# Patient Record
Sex: Male | Born: 1980 | Race: White | Hispanic: No | Marital: Single | Smoking: Current every day smoker
Health system: Southern US, Community
[De-identification: ages and names within clinical notes are randomized; demographics above are authoritative.]

## PROBLEM LIST (undated history)

## (undated) DIAGNOSIS — F319 Bipolar disorder, unspecified: Secondary | ICD-10-CM

## (undated) DIAGNOSIS — E039 Hypothyroidism, unspecified: Secondary | ICD-10-CM

---

## 2008-09-24 ENCOUNTER — Emergency Department (HOSPITAL_COMMUNITY): Admission: EM | Admit: 2008-09-24 | Disposition: A | Discharge status: Home / Self Care | Payer: Self-pay

## 2008-09-24 ENCOUNTER — Encounter (EMERGENCY_DEPARTMENT_HOSPITAL): Payer: Self-pay

## 2009-01-17 ENCOUNTER — Encounter (EMERGENCY_DEPARTMENT_HOSPITAL): Payer: Self-pay

## 2009-01-17 ENCOUNTER — Emergency Department (EMERGENCY_DEPARTMENT_HOSPITAL): Admission: EM | Admit: 2009-01-17 | Discharge: 2009-01-17 | Payer: Self-pay

## 2009-09-11 ENCOUNTER — Emergency Department (EMERGENCY_DEPARTMENT_HOSPITAL): Admission: EM | Admit: 2009-09-11 | Discharge: 2009-09-11 | Payer: Self-pay

## 2009-09-11 ENCOUNTER — Encounter (EMERGENCY_DEPARTMENT_HOSPITAL): Payer: Self-pay

## 2010-02-13 ENCOUNTER — Emergency Department (EMERGENCY_DEPARTMENT_HOSPITAL): Payer: Self-pay

## 2010-02-13 ENCOUNTER — Emergency Department (EMERGENCY_DEPARTMENT_HOSPITAL): Admission: EM | Admit: 2010-02-13 | Discharge: 2010-02-13 | Payer: Self-pay

## 2010-02-20 ENCOUNTER — Emergency Department (EMERGENCY_DEPARTMENT_HOSPITAL): Payer: Self-pay

## 2010-02-20 ENCOUNTER — Emergency Department (EMERGENCY_DEPARTMENT_HOSPITAL): Admission: EM | Admit: 2010-02-20 | Discharge: 2010-02-20 | Payer: Self-pay

## 2010-07-19 ENCOUNTER — Emergency Department (EMERGENCY_DEPARTMENT_HOSPITAL): Payer: Self-pay

## 2010-07-19 ENCOUNTER — Emergency Department (EMERGENCY_DEPARTMENT_HOSPITAL): Admission: EM | Admit: 2010-07-19 | Discharge: 2010-07-19 | Payer: Self-pay

## 2011-03-04 ENCOUNTER — Emergency Department (EMERGENCY_DEPARTMENT_HOSPITAL): Payer: Self-pay

## 2011-03-04 ENCOUNTER — Emergency Department (EMERGENCY_DEPARTMENT_HOSPITAL): Admission: EM | Admit: 2011-03-04 | Discharge: 2011-03-04 | Payer: Self-pay

## 2012-04-16 ENCOUNTER — Emergency Department (EMERGENCY_DEPARTMENT_HOSPITAL)
Admission: EM | Admit: 2012-04-16 | Discharge: 2012-04-16 | Payer: Self-pay | Attending: Emergency Medicine | Admitting: Emergency Medicine

## 2012-04-16 ENCOUNTER — Emergency Department (EMERGENCY_DEPARTMENT_HOSPITAL): Payer: Self-pay

## 2013-01-03 ENCOUNTER — Emergency Department
Admission: EM | Admit: 2013-01-03 | Discharge: 2013-01-04 | Disposition: A | Discharge status: Home / Self Care | Payer: BC Managed Care – PPO | Attending: Emergency Medicine | Admitting: Emergency Medicine

## 2013-01-03 ENCOUNTER — Emergency Department (EMERGENCY_DEPARTMENT_HOSPITAL): Payer: MEDICAID

## 2013-01-03 ENCOUNTER — Encounter (HOSPITAL_COMMUNITY): Payer: Self-pay | Admitting: NURSE PRACTITIONER

## 2013-01-03 DIAGNOSIS — H6691 Otitis media, unspecified, right ear: Secondary | ICD-10-CM

## 2013-01-03 DIAGNOSIS — H6690 Otitis media, unspecified, unspecified ear: Secondary | ICD-10-CM | POA: Insufficient documentation

## 2013-01-03 DIAGNOSIS — H9209 Otalgia, unspecified ear: Secondary | ICD-10-CM

## 2013-01-03 MED ORDER — AMOXICILLIN 500 MG CAPSULE
500.00 mg | ORAL_CAPSULE | Freq: Three times a day (TID) | ORAL | Status: DC
Start: 2013-01-03 — End: 2013-01-07

## 2013-01-03 NOTE — ED Nurses Note (Signed)
Patient c/o right ear pain.

## 2013-01-03 NOTE — ED Nurses Note (Signed)
Discharge instructions provided. Pt verbalizes understanding of care and good understanding of prescribed medication. Pt ambulatory from the ED upon discharge.

## 2013-01-03 NOTE — ED Provider Notes (Signed)
Attending Physician: Dr. Zada Girt  CC: Right Ear Pain   HPI:  Derek Randall is a 32 y.o. male who presents to the ED via POV c/o complaining of right ear pain and pressure.  Patient states his ear has been draining too.      Review of Systems:  Constitutional: No fever or chills  Skin: No rash or wounds  HENT: No headaches Right ear pain and drainage  Eyes: No vision changes   Cardio: No chest pain  Respiratory: No shortness of breath  GI:  No nausea, vomiting, or abdominal pain  GU:  No urinary changes  MSK: No joint or back pain  Neuro: No numbness/tingling or weakness  Psychiatric: No substance abuse  All other systems reviewed and are negative or as noted in HPI.      History:  PMH:  History reviewed. No pertinent past medical history.  Previous Medications    No medications on file     PSH:  History reviewed. No pertinent past surgical history.  Social Hx:    History     Social History   . Marital Status: Single     Spouse Name: N/A     Number of Children: N/A   . Years of Education: N/A     Occupational History   . Not on file.     Social History Main Topics   . Smoking status: Not on file   . Smokeless tobacco: Not on file   . Alcohol Use: Not on file   . Drug Use: Not on file   . Sexually Active: Not on file     Other Topics Concern   . Not on file     Social History Narrative   . No narrative on file     Family Hx: No family history on file.  Allergies: Not on File    Above history reviewed with patient, changes are as documented.      Physical Exam:   Nursing notes reviewed    ED Triage Vitals   Enc Vitals Group      BP (Non-Invasive) 01/03/13 2159 126/72 mmHg      Heart Rate 01/03/13 2159 97      Respiratory Rate 01/03/13 2159 16      Temperature 01/03/13 2159 36.7 C (98.1 F)      Temp src --       SpO2-1 01/03/13 2159 99 %      Weight 01/03/13 2159 81.647 kg (180 lb)      Height 01/03/13 2159 1.88 m (6\' 2" )      Head Cir --       Peak Flow --       Pain Score --       Pain Loc --       Pain Edu? --     Excl. in GC? --      Constitutional: NAD.   HEENT: Normocephalic and atraumatic.   Mouth/Throat: Moist mucous membranes.   Eyes: Conjugate gaze, PERRL   Neck: Trachea midline. Neck supple.   Ears: Left TM pearl grey with good landmarks.  Right TM erythematous with purulent drainage   Cardiovascular: RRR, No murmurs, rubs or gallops. Intact distal pulses.  Pulmonary/Chest: BS equal bilaterally. No respiratory distress. No wheezes, rales or chest tenderness.   Abdominal: BS +. Abdomen soft, no tenderness, rebound or guarding.  Musculoskeletal: No edema, tenderness or deformity.  Skin: Warm and dry. No rash, erythema, pallor or cyanosis.  Psychiatric: Behavior  is normal.  Neurological: Alert and oriented x3. Grossly intact. Gait normal.      Course and MDM:  MDM      Impression: Pt presenting c/o right ear pain concerning for, but not limited to, otitis media v ruptured TM v otitis externa.    Plan: Medical Records reviewed. Will obtain the following labs/imaging and give pt the following medications to alleviate symptoms:  Orders Placed This Encounter   . amoxicillin (AMOXIL) 500 mg Oral Capsule   . RETURN TO WORK/SCHOOL       Labs Reviewed - No data to display  All labs were reviewed.     Radiographical Imaging:          Course:  History an physical exam  Discussed findings with the patient consistent with Otitis Media of the right ear.    Prescribed amoxicillin     Disposition:  Discharged    Disposition: Discharged    Follow up:   [facility], Resident One      As needed    [facility] Emergency Department  [address]  [place] [address]  [phone]    As needed if symptoms worsen      Clinical Impression:     Encounter Diagnosis   Name Primary?   . Otitis media of right ear Yes       Future Appointments scheduled in [place]:   No future appointments.    Current Discharge Medication List      START taking these medications    Details   amoxicillin (AMOXIL) 500 mg Oral Capsule Take 1 Cap (500 mg total) by mouth Three times  a day for 10 days  Qty: 30 Cap, Refills: 0           Standley Dakins, FNP 01/03/2013, 10:27 PM

## 2013-01-07 ENCOUNTER — Emergency Department (EMERGENCY_DEPARTMENT_HOSPITAL): Payer: BC Managed Care – PPO

## 2013-01-07 ENCOUNTER — Encounter (HOSPITAL_COMMUNITY): Payer: Self-pay

## 2013-01-07 ENCOUNTER — Emergency Department (EMERGENCY_DEPARTMENT_HOSPITAL): Payer: Self-pay

## 2013-01-07 ENCOUNTER — Emergency Department
Admission: EM | Admit: 2013-01-07 | Discharge: 2013-01-07 | Disposition: A | Discharge status: Home / Self Care | Payer: BC Managed Care – PPO | Attending: Emergency Medicine | Admitting: Emergency Medicine

## 2013-01-07 DIAGNOSIS — H65199 Other acute nonsuppurative otitis media, unspecified ear: Secondary | ICD-10-CM | POA: Insufficient documentation

## 2013-01-07 DIAGNOSIS — F17200 Nicotine dependence, unspecified, uncomplicated: Secondary | ICD-10-CM | POA: Insufficient documentation

## 2013-01-07 DIAGNOSIS — H70009 Acute mastoiditis without complications, unspecified ear: Secondary | ICD-10-CM | POA: Insufficient documentation

## 2013-01-07 MED ORDER — LACTATED RINGERS IV BOLUS
1000.0000 mL | INJECTION | Status: AC
Start: 2013-01-07 — End: 2013-01-07
  Administered 2013-01-07: 0 mL via INTRAVENOUS
  Administered 2013-01-07: 1000 mL via INTRAVENOUS

## 2013-01-07 MED ORDER — ONDANSETRON HCL (PF) 4 MG/2 ML INJECTION SOLUTION
4.00 mg | INTRAMUSCULAR | Status: AC
Start: 2013-01-07 — End: 2013-01-07
  Administered 2013-01-07: 4 mg via INTRAVENOUS
  Filled 2013-01-07: qty 2

## 2013-01-07 MED ORDER — CIPROFLOXACIN 500 MG TABLET
500.00 mg | ORAL_TABLET | Freq: Two times a day (BID) | ORAL | Status: DC
Start: 2013-01-07 — End: 2013-01-10

## 2013-01-07 MED ORDER — MECLIZINE 12.5 MG TABLET
12.5000 mg | ORAL_TABLET | Freq: Once | ORAL | Status: AC
Start: 2013-01-07 — End: 2013-01-07
  Administered 2013-01-07: 12.5 mg via ORAL
  Filled 2013-01-07: qty 1

## 2013-01-07 MED ORDER — PREDNISONE 20 MG TABLET
40.0000 mg | ORAL_TABLET | Freq: Every day | ORAL | Status: DC
Start: 2013-01-07 — End: 2013-01-10

## 2013-01-07 MED ORDER — HYDROCODONE 5 MG-ACETAMINOPHEN 325 MG TABLET
1.00 | ORAL_TABLET | Freq: Four times a day (QID) | ORAL | Status: DC | PRN
Start: 2013-01-07 — End: 2013-01-10

## 2013-01-07 MED ORDER — ANTIPYRINE-BENZOCAINE 5.4 %-1.4 % EAR DROPS
3.0000 [drp] | Freq: Once | OTIC | Status: AC
Start: 2013-01-07 — End: 2013-01-07
  Administered 2013-01-07: 3 [drp] via OTIC
  Filled 2013-01-07: qty 15

## 2013-01-07 MED ORDER — HYDROMORPHONE 1 MG/ML INJECTION WRAPPER
INJECTION | INTRAMUSCULAR | Status: AC
Start: 2013-01-07 — End: 2013-01-07
  Administered 2013-01-07: 0.4 mg via INTRAVENOUS
  Filled 2013-01-07: qty 1

## 2013-01-07 MED ORDER — HYDROMORPHONE 1 MG/ML INJECTION WRAPPER
0.4000 mg | INJECTION | Freq: Once | INTRAMUSCULAR | Status: AC
Start: 2013-01-07 — End: 2013-01-07

## 2013-01-07 MED ORDER — AMOXICILLIN 875 MG-POTASSIUM CLAVULANATE 125 MG TABLET
1.00 | ORAL_TABLET | Freq: Two times a day (BID) | ORAL | Status: AC
Start: 2013-01-07 — End: 2013-01-17

## 2013-01-07 MED ORDER — MORPHINE 4 MG/ML INJECTION SYRINGE
8.00 mg | INJECTION | INTRAMUSCULAR | Status: AC
Start: 2013-01-07 — End: 2013-01-07
  Administered 2013-01-07: 4 mg via INTRAVENOUS
  Filled 2013-01-07: qty 1

## 2013-01-07 MED ADMIN — morphine 2 mg/mL injection syringe: 4 mg | NDC 00409189001

## 2013-01-07 MED ADMIN — ondansetron HCl (PF) 4 mg/2 mL injection solution: 4 mg | INTRAVENOUS | NDC 00703722101

## 2013-01-07 MED FILL — morphine 2 mg/mL intravenous syringe: INTRAVENOUS | Qty: 2 | Status: AC

## 2013-01-07 MED FILL — ondansetron HCl (PF) 4 mg/2 mL injection solution: 4.0000 mg | INTRAMUSCULAR | Qty: 2 | Status: AC

## 2013-01-07 NOTE — ED Nurses Note (Signed)
Pt A&Ox3. MAEs. Pt reports being seen here on Monday for R ear infection, given amoxicillin and told to come back to ED for continued symptoms. Pt reports he has not finished amoxicillin prescription. Pt reports continued pain and swelling and vertigo. Waiting provider evaluation.

## 2013-01-07 NOTE — ED Nurses Note (Signed)
Patient in ENT room on [address].  Patient effusion puntured and drained by ENT, patient feels nauseous and pain.  Meds given per MAR.

## 2013-01-07 NOTE — ED Nurses Note (Signed)
ENT at bedside

## 2013-01-07 NOTE — ED Provider Notes (Signed)
The following note was written per dictation of Derek Appl, PA-C.  Derek Randall, SCRIBE  Attending: Dr. Alla Randall  CC: Ear Pain    HPI  Derek Randall is a 32 y.o. male who presents to the ED via POV c/o ear pain. Randall states that he was seen in the ED on 3/24 for right ear pain with brown drainage, right sided headache, and dizziness, symptoms began on 3/22.  Randall states dizziness is upon standing or occurs with any sudden movements. Associated decreased hearing from right ear, decreased appetite, nausea, vomiting, and myalgias. Has vomited 4-5 time since onset of symptoms. Randall was given amoxicillin at that time with minimal relief of symptoms. Randall states that he has been taking 3 tylenol, TIB for pain without relief. Pain is exacerbated when he bends over, describes pain to be a pressure like sensation. Denies double vision, numbness or tingling. Denies PMH. NKDA.     ROS  Constitutional: + dizziness, + decreased appetite. No fever, chills or weakness   Skin: No rash or diaphoresis  HENT: + headaches, + ear pain, no congestion  Eyes: No vision changes   Cardio: No chest pain, palpitations or leg swelling   Respiratory: No cough, wheezing or SOB  GI:  + nausea, + vomiting, no stool changes  GU:  No urinary changes  MSK: + myalgias.   Neuro: No seizures or LOC  Psychiatric: No depression, SI or substance abuse  All other systems reviewed and are negative.    PMH:  History reviewed. No pertinent past medical history.  PSH:  History reviewed. No pertinent past surgical history.  Social Hx:    History     Social History   . Marital Status: Single     Spouse Name: N/A     Number of Children: N/A   . Years of Education: N/A     Occupational History   . Not on file.     Social History Main Topics   . Smoking status: Current Every Day Smoker -- 1.00 packs/day     Types: Cigarettes   . Smokeless tobacco: Not on file   . Alcohol Use: Yes      Comment: rarely   . Drug Use: No   . Sexually Active: Not on file      Other Topics Concern   . Not on file     Social History Narrative   . No narrative on file     Family Hx: No family history on file.  Allergies: No Known Allergies    Above history reviewed with patient, changes are as documented.    Nursing notes reviewed.    Physical Exam  Nursing note and vitals reviewed.    Filed Vitals:    01/07/13 1218 01/07/13 1519   BP: 122/72 124/60   Pulse: 104 59   Temp: 36.7 C (98.1 F)    Resp: 16 16   Height: 1.88 m (6' 2.02")    Weight: 81.647 kg (180 lb)    SpO2: 100% 99%       Constitutional: Randall is alert and oriented and appears well-developed and well-nourished. No acute distress.   HENT:   Mouth/Throat: Oropharynx is clear and moist.   Eyes: Conjunctivae and extraocular motions are normal. Pupils are equal, round, and reactive to light.   Neck: Normal range of motion. Neck supple.   Ears: TTP of the right mastoid. Right ear canal is slightly erythematous. Otoscopic exam appears painful. No light reflex. Bulging. Small  amount of loose brownish discharge in the ear canal, may or may not be cerumen.   Cardiovascular: Normal rate, regular rhythm and intact distal pulses.    Pulmonary/Chest: Effort normal and breath sounds normal. No respiratory distress.   Abdominal: Abdomen is soft, nontender, nondistended.    Musculoskeletal: Normal range of motion.   Neurological: Randall is alert and oriented. Cranial nerves are grossly intact (2-12).    Skin: Skin is warm and dry without rash.  Color normal.      MDM      Impression/Plan: Randall presents to the ED c/o ear pain. Concern for AOM vs AOM with effusion vs mastoiditis. Appropriate imaging and labs ordered.     Orders Placed This Encounter   . CT IAMS WO IV CONTRAST   . ondansetron (ZOFRAN) 2 mg/mL injection   . LR bolus infusion 1,000 mL   . meclizine (ANTIVERT) tablet   . morphine 4 mg/mL injection   . antipyrine-benzocaine (AURALGAN) 5.4%-1.4% otic solution   . morphine injection ---[employer] Reviewed - No data to display   All labs were reviewed.    Radiographical Imaging:   Results for orders placed during the hospital encounter of 01/07/13 (from the past 72 hour(s))   CT IAMS WO IV CONTRAST     Status: Normal    Narrative:     EXAMINATION: Unenhanced CT of the IAMs performed on 07 January 2013    INDICATION: Mastoid tenderness, ear pain    FINDINGS: A right mastoid effusion is present, along with a small amount   of fluid in the middle ear on the right surrounding the ossicles.  There   is some mild mucosal thickening of the turbinates and ethmoids as well as   within the bilateral maxillary sinuses.  The included osseous structures   are unremarkable.      Impression:      Right mastoid and middle ear effusions.           3:00 PM  ENT paged.    4:06 PM  ENT paged again.     4:08 PM  ENT returned page and consulted at this time. Derek Randall.   ENT did myringotomy without tube placement. Home on cipro, Augmentin, Norco and prednisone. Follow up with ENT    No otic lidocaine drops due to myringotomy   I have reviewed and verified the information in the above note is an accurate dictation of the information supplied to Derek Randall, ED Scribe.    Derek Randall Derek [place], PA-C 01/07/2013, 9:55 PM

## 2013-01-07 NOTE — ED Nurses Note (Signed)
3 drops placed in R ear and pt medicated for pain per order, see MAR. Pt moved to RP.

## 2013-01-07 NOTE — ED Nurses Note (Signed)
 Pt OTF with ENT for laryngotomy.

## 2013-01-07 NOTE — ED Nurses Note (Signed)
Patient was given discharge instructions and verbalizes understanding. Saline lock was removed. Catheter intact and no redness or swelling at site.

## 2013-01-07 NOTE — ED Nurses Note (Signed)
Pt to CT scan.

## 2013-01-07 NOTE — ED Nurses Note (Signed)
 Patient discharged with written instructions given.  IV removed per RN.

## 2013-01-07 NOTE — ED Nurses Note (Signed)
Pt medicated for pain and nausea per order, IVF infusing, see MAR. Waiting ear drops from pharmacy.

## 2013-01-10 ENCOUNTER — Ambulatory Visit: Payer: MEDICAID | Attending: Otolaryngology | Admitting: Otolaryngology

## 2013-01-10 VITALS — BP 126/64 | HR 87 | Temp 98.0°F | Ht 73.43 in | Wt 175.5 lb

## 2013-01-10 MED ORDER — PREDNISONE 20 MG TABLET
20.0000 mg | ORAL_TABLET | Freq: Every day | ORAL | Status: DC
Start: 2013-01-10 — End: 2013-01-10

## 2013-01-10 MED ORDER — PREDNISONE 20 MG TABLET
20.0000 mg | ORAL_TABLET | Freq: Every day | ORAL | Status: DC
Start: 2013-01-10 — End: 2019-06-07

## 2013-01-12 ENCOUNTER — Ambulatory Visit (INDEPENDENT_AMBULATORY_CARE_PROVIDER_SITE_OTHER): Payer: Self-pay | Admitting: Otolaryngology

## 2013-01-12 LAB — EAR CULTURE WITH GRAM STAIN

## 2013-01-12 NOTE — Telephone Encounter (Signed)
Per Dr. Jenean Lindau, okay to be off of work for dizziness. Wrote script, patient will come to office to pick up. Advised that on RPV on Monday he should ask if he still needs to bee off of work and if he does a new excuse will need to be made.

## 2013-01-12 NOTE — Telephone Encounter (Signed)
Message copied by Caron Presume on Wed Jan 12, 2013  1:47 PM  ------       Message from: Saverio Danker       Created: Wed Jan 12, 2013 11:56 AM         >> Saverio Danker 01/12/2013 11:56 AM       [place] patient, Patient called, Patient needs a letter for work stating he is unable to be at high heights due to dizziness. Patient's job is working at [employer]. Patient's employer needs letter today or he will lose his job. Please call patient at above # when ready for pick up. Thank you.   ------

## 2013-01-17 ENCOUNTER — Ambulatory Visit (HOSPITAL_BASED_OUTPATIENT_CLINIC_OR_DEPARTMENT_OTHER): Payer: BC Managed Care – PPO | Admitting: Audiologist

## 2013-01-17 ENCOUNTER — Ambulatory Visit: Payer: BC Managed Care – PPO | Attending: Otolaryngology | Admitting: Otolaryngology

## 2013-01-17 ENCOUNTER — Encounter (INDEPENDENT_AMBULATORY_CARE_PROVIDER_SITE_OTHER): Payer: Self-pay | Admitting: Otolaryngology

## 2013-01-17 VITALS — BP 122/68 | HR 87 | Temp 98.3°F | Ht 73.07 in | Wt 169.5 lb

## 2013-01-17 DIAGNOSIS — Z9889 Other specified postprocedural states: Secondary | ICD-10-CM | POA: Insufficient documentation

## 2013-01-17 DIAGNOSIS — R42 Dizziness and giddiness: Secondary | ICD-10-CM | POA: Insufficient documentation

## 2013-01-17 DIAGNOSIS — Z0110 Encounter for examination of ears and hearing without abnormal findings: Secondary | ICD-10-CM | POA: Insufficient documentation

## 2013-01-17 DIAGNOSIS — R269 Unspecified abnormalities of gait and mobility: Secondary | ICD-10-CM | POA: Insufficient documentation

## 2013-01-17 DIAGNOSIS — H908 Mixed conductive and sensorineural hearing loss, unspecified: Secondary | ICD-10-CM | POA: Insufficient documentation

## 2013-01-17 DIAGNOSIS — H903 Sensorineural hearing loss, bilateral: Secondary | ICD-10-CM | POA: Insufficient documentation

## 2013-01-17 NOTE — Progress Notes (Addendum)
 PATIENT NAME:  Derek Randall  MRN:  [id number]  DOB:  [birth year]  DATE OF SERVICE: 01/17/2013    HPI:  Derek Randall is a 32 y.o. year old male with history of right otitis media s/p myringotomy and aspiration in ED on 01/07/2013.  Since then he has had decreased pain/pressure but hearing is still not the same as the left.  He also has episodic dizziness with vertigo and imbalance.  These episodes last about 20-30 minutes with associated right ear fullness.  He denies any dizziness before this ear infection.  He denies any further otorrhea.    Physical Exam:  Blood pressure 122/68, pulse 87, temperature 36.8 C (98.3 F), height 1.856 m (6' 1.07), weight 76.9 kg (169 lb 8.5 oz).  Body mass index is 22.32 kg/(m^2).  General Appearance: Pleasant, cooperative, healthy, and in no acute distress.  Eyes: Conjunctivae/corneas clear, EOM's intact.  Head and Face: Normocephalic, atraumatic.  Face symmetric, no obvious lesions.   Pinnae: Normal shape and position.   External auditory canals:  Patent without inflammation.  Tympanic membranes:  Right TM opaque, mobile with small crust anteriorly.  Good mobility.  Left: Intact, translucent, midposition, middle ear aerated. Mobile  Nose:  External pyramid midline. Septum midline. Mucosa normal. No purulence, polyps, or crusts.   Oral Cavity/Oropharynx: No mucosal lesions, masses, or pharyngeal asymmetry.  Neck:  No palpable thyroid, salivary gland, or neck masses.  Heme/Lymph:  No cervical adenopathy.  Cardiovascular:  Good perfusion of upper extremities.  No cyanosis of the hands or fingers.  Lungs: No apparent stridorous breathing. No acute distress.  Skin: Skin warm and dry.  Neurologic: Cranial nerves:  grossly intact.  Psychiatric:  Alert and oriented x 3.    Procedure:  No notes on file    Data Reviewed:    Audiogram 01/17/2013 -   IMP: Left is essentially WNL; Type A tymp.   Right with slight/mild mixed across freqs with mod/sev mixed dip at 4K. Type C tymp.   REC: To see Dr. Sharyle  today. Repeat audio during continued medical management.       Assessment:  Right otitis media  Right sided asymmetric mixed loss  Dizziness    Plan:  Repeat audiogram/tymp in 3 weeks, followup that day      Redell Layman Re, MD 01/17/2013, 3:31 PM    PCP:  No Established Pcp  No address on file   REF:  No referring provider defined for this encounter.             I saw and examined the patient.  I reviewed the resident's note.  I agree with the findings and plan of care as documented in the resident's note.  Any exceptions/additions are edited/noted.    Carlin FORBES Sharyle, MD 01/17/2013, 4:27 PM

## 2013-01-17 NOTE — Progress Notes (Signed)
 AUDIOGRAM    Hx: Follow up ENT visit of 01-10-13. Continued dizziness, muffled hearing. Positive for noise exposure.    IMP: Left is essentially WNL; Type A tymp.           Right with slight/mild mixed across freqs with mod/sev mixed dip at 4K. Type C tymp.     REC: To see Dr. Sharyle today. Repeat audio during continued medical management.     [facility]

## 2013-02-14 ENCOUNTER — Ambulatory Visit (INDEPENDENT_AMBULATORY_CARE_PROVIDER_SITE_OTHER): Payer: MEDICAID | Admitting: Audiologist

## 2013-02-14 ENCOUNTER — Ambulatory Visit (INDEPENDENT_AMBULATORY_CARE_PROVIDER_SITE_OTHER): Payer: MEDICAID | Admitting: Otolaryngology

## 2013-03-16 ENCOUNTER — Emergency Department (EMERGENCY_DEPARTMENT_HOSPITAL): Payer: MEDICAID

## 2013-03-16 ENCOUNTER — Emergency Department (EMERGENCY_DEPARTMENT_HOSPITAL)
Admission: EM | Admit: 2013-03-16 | Discharge: 2013-03-16 | Payer: MEDICAID | Attending: EMERGENCY MEDICINE | Admitting: EMERGENCY MEDICINE

## 2015-05-13 ENCOUNTER — Emergency Department
Admission: EM | Admit: 2015-05-13 | Discharge: 2015-05-13 | Disposition: A | Discharge status: Home / Self Care | Payer: Medicaid - Out of State | Attending: Emergency Medicine | Admitting: Emergency Medicine

## 2015-05-13 ENCOUNTER — Encounter: Payer: Self-pay | Admitting: Emergency Medicine

## 2015-05-13 DIAGNOSIS — W260XXA Contact with knife, initial encounter: Secondary | ICD-10-CM | POA: Insufficient documentation

## 2015-05-13 DIAGNOSIS — Y998 Other external cause status: Secondary | ICD-10-CM | POA: Insufficient documentation

## 2015-05-13 DIAGNOSIS — S81812A Laceration without foreign body, left lower leg, initial encounter: Secondary | ICD-10-CM | POA: Insufficient documentation

## 2015-05-13 DIAGNOSIS — Z23 Encounter for immunization: Secondary | ICD-10-CM | POA: Diagnosis not present

## 2015-05-13 DIAGNOSIS — Z720 Tobacco use: Secondary | ICD-10-CM | POA: Insufficient documentation

## 2015-05-13 DIAGNOSIS — Y9389 Activity, other specified: Secondary | ICD-10-CM | POA: Diagnosis not present

## 2015-05-13 DIAGNOSIS — Y9289 Other specified places as the place of occurrence of the external cause: Secondary | ICD-10-CM | POA: Insufficient documentation

## 2015-05-13 MED ORDER — LIDOCAINE-EPINEPHRINE (PF) 1 %-1:200000 IJ SOLN
30.0000 mL | Freq: Once | INTRAMUSCULAR | Status: AC
  Administered 2015-05-13: 10 mL via INTRADERMAL
  Filled 2015-05-13: qty 30

## 2015-05-13 MED ORDER — KETOROLAC TROMETHAMINE 10 MG PO TABS
20.0000 mg | ORAL_TABLET | Freq: Once | ORAL | Status: AC
  Administered 2015-05-13: 20 mg via ORAL
  Filled 2015-05-13: qty 2

## 2015-05-13 MED ORDER — KETOROLAC TROMETHAMINE 10 MG PO TABS: 10.0000 mg | ORAL_TABLET | Freq: Three times a day (TID) | ORAL | Status: AC

## 2015-05-13 MED ORDER — TETANUS-DIPHTH-ACELL PERTUSSIS 5-2.5-18.5 LF-MCG/0.5 IM SUSP
0.5000 mL | Freq: Once | INTRAMUSCULAR | Status: AC
  Administered 2015-05-13: 0.5 mL via INTRAMUSCULAR
  Filled 2015-05-13: qty 0.5

## 2015-05-13 NOTE — ED Notes (Signed)
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 Patient with laceration to leg. Bleeding controlled at this time.

## 2015-05-13 NOTE — ED Provider Notes (Signed)
[facility] Emergency Department Provider Note ____________________________________________  Time seen: 2050  I have reviewed the triage vital signs and the nursing notes.  HISTORY  Chief Complaint  Extremity Laceration  HPI Brett Reed is a 34 y.o. male reports to the ED for treatment of laceration to the left lower leg, sustained while at work today. He reports an accidental, self-inflicted laceration to the left lower calf while using a knife. He denies any other injury at this time, bleeding is currently controlled. His tetanus is not current according to the patient. He reports his pain at 9/10 in triage.  No past medical history on file.  There are no active problems to display for this patient.  No past surgical history on file.  Current Outpatient Rx  Name  Route  Sig  Dispense  Refill  . ketorolac (TORADOL) 10 MG tablet   Oral   Take 1 tablet (10 mg total) by mouth every 8 (eight) hours.   15 tablet   0    Allergies Codeine  History reviewed. No pertinent family history.  Social History History  Substance Use Topics  . Smoking status: Current Every Day Smoker -- 1.00 packs/day for 15 years    Types: Cigarettes  . Smokeless tobacco: Not on file  . Alcohol Use: No   Review of Systems  Constitutional: Negative for fever. Eyes: Negative for visual changes. ENT: Negative for sore throat. Cardiovascular: Negative for chest pain. Respiratory: Negative for shortness of breath. Gastrointestinal: Negative for abdominal pain, vomiting and diarrhea. Genitourinary: Negative for dysuria. Musculoskeletal: Negative for back pain. Skin: Negative for rash. Laceration as above. Neurological: Negative for headaches, focal weakness or numbness. ____________________________________________  PHYSICAL EXAM:  VITAL SIGNS: ED Triage Vitals  Enc Vitals Group     BP 05/13/15 1910 133/76 mmHg     Pulse Rate 05/13/15 1910 97     Resp 05/13/15 1910 20      Temp 05/13/15 1910 98.2 F (36.8 C)     Temp Source 05/13/15 1910 Oral     SpO2 05/13/15 1910 97 %     Weight 05/13/15 1910 180 lb (81.647 kg)     Height 05/13/15 1910 6\' 2"  (1.88 m)     Head Cir --      Peak Flow --      Pain Score 05/13/15 1911 9     Pain Loc --      Pain Edu? --      Excl. in GC? --    Constitutional: Alert and oriented. Well appearing and in no distress. Eyes: Conjunctivae are normal. PERRL. Normal extraocular movements. ENT   Head: Normocephalic and atraumatic.   Nose: No congestion/rhinnorhea.   Mouth/Throat: Mucous membranes are moist. Cardiovascular: Normal distal pulses. Respiratory: Normal respiratory effort. Musculoskeletal: Nontender with normal range of motion in all extremities. Linear laceration to the lower left calf laterally. Bleeding controlled.  Neurologic:  Normal gait without ataxia. Normal speech and language. No gross focal neurologic deficits are appreciated. Skin:  Skin is warm, dry and intact. No rash noted. Psychiatric: Mood and affect are normal. Patient exhibits appropriate insight and judgment. ____________________________________________  PROCEDURES  Tdap booster provided  LACERATION REPAIR Performed by: Lissa Hoard Authorized by: Lissa Hoard Consent: Verbal consent obtained. Risks and benefits: risks, benefits and alternatives were discussed Consent given by: patient Patient identity confirmed: provided demographic data Prepped and Draped in normal sterile fashion Wound explored  Laceration Location: Left lower leg  Laceration  Length: 6 cm  No Foreign Bodies seen or palpated  Anesthesia: local infiltration  Local anesthetic: lidocaine 1% w/epinephrine  Anesthetic total: 5 ml  Irrigation method: syringe Amount of cleaning: standard  Skin closure: 4-0 nylon   Number of sutures: 9  Technique: interrupted  Patient tolerance: Patient tolerated the procedure well with no  immediate complications. ____________________________________________  INITIAL IMPRESSION / ASSESSMENT AND PLAN / ED COURSE  Wound care instructions provider.  Patient requesting pain medicine prescription following procedure. Advised that narcotics would not be prescribed, given Toradol #15 for pain. Return to the ED in 10-12 days for suture removal.  ____________________________________________  FINAL CLINICAL IMPRESSION(S) / ED DIAGNOSES  Final diagnoses:  Laceration of leg, left, initial encounter     Lissa Hoard, PA-C 05/14/15 0010  Emily Filbert, MD 05/14/15 [phone]

## 2015-05-13 NOTE — Discharge Instructions (Signed)
Laceration Care, Adult A laceration is a cut that goes through all layers of the skin. The cut goes into the tissue beneath the skin. HOME CARE For stitches (sutures) or staples:  Keep the cut clean and dry.  If you have a bandage (dressing), change it at least once a day. Change the bandage if it gets wet or dirty, or as told by your doctor.  Wash the cut with soap and water 2 times a day. Rinse the cut with water. Pat it dry with a clean towel.  Put a thin layer of medicated cream on the cut as told by your doctor.  You may shower after the first 24 hours. Do not soak the cut in water until the stitches are removed.  Only take medicines as told by your doctor.  Have your stitches or staples removed as told by your doctor. For skin adhesive strips:  Keep the cut clean and dry.  Do not get the strips wet. You may take a bath, but be careful to keep the cut dry.  If the cut gets wet, pat it dry with a clean towel.  The strips will fall off on their own. Do not remove the strips that are still stuck to the cut. For wound glue:  You may shower or take baths. Do not soak or scrub the cut. Do not swim. Avoid heavy sweating until the glue falls off on its own. After a shower or bath, pat the cut dry with a clean towel.  Do not put medicine on your cut until the glue falls off.  If you have a bandage, do not put tape over the glue.  Avoid lots of sunlight or tanning lamps until the glue falls off. Put sunscreen on the cut for the first year to reduce your scar.  The glue will fall off on its own. Do not pick at the glue. You may need a tetanus shot if:  You cannot remember when you had your last tetanus shot.  You have never had a tetanus shot. If you need a tetanus shot and you choose not to have one, you may get tetanus. Sickness from tetanus can be serious. GET HELP RIGHT AWAY IF:   Your pain does not get better with medicine.  Your arm, hand, leg, or foot loses feeling  (numbness) or changes color.  Your cut is bleeding.  Your joint feels weak, or you cannot use your joint.  You have painful lumps on your body.  Your cut is red, puffy (swollen), or painful.  You have a red line on the skin near the cut.  You have yellowish-white fluid (pus) coming from the cut.  You have a fever.  You have a bad smell coming from the cut or bandage.  Your cut breaks open before or after stitches are removed.  You notice something coming out of the cut, such as wood or glass.  You cannot move a finger or toe. MAKE SURE YOU:   Understand these instructions.  Will watch your condition.  Will get help right away if you are not doing well or get worse. Document Released: 03/17/2008 Document Revised: 12/22/2011 Document Reviewed: 03/25/2011 [facility] Patient Information 2015 [place], [place]. This information is not intended to replace advice given to you by your health care provider. Make sure you discuss any questions you have with your health care provider.   Keep the wound clean, dry, and covered. Wash only with soap & water. Return in 10-12 days  for suture removal.

## 2015-08-15 ENCOUNTER — Emergency Department: Payer: Medicaid - Out of State

## 2015-08-15 ENCOUNTER — Encounter: Payer: Self-pay | Admitting: Emergency Medicine

## 2015-08-15 ENCOUNTER — Emergency Department
Admission: EM | Admit: 2015-08-15 | Discharge: 2015-08-15 | Disposition: A | Discharge status: Home / Self Care | Payer: Medicaid - Out of State | Attending: Emergency Medicine | Admitting: Emergency Medicine

## 2015-08-15 ENCOUNTER — Emergency Department
Admission: EM | Admit: 2015-08-15 | Discharge: 2015-08-15 | Disposition: A | Discharge status: Home / Self Care | Payer: Medicaid - Out of State | Source: Home / Self Care | Attending: Emergency Medicine | Admitting: Emergency Medicine

## 2015-08-15 DIAGNOSIS — S0990XA Unspecified injury of head, initial encounter: Secondary | ICD-10-CM | POA: Diagnosis not present

## 2015-08-15 DIAGNOSIS — Y92410 Unspecified street and highway as the place of occurrence of the external cause: Secondary | ICD-10-CM | POA: Insufficient documentation

## 2015-08-15 DIAGNOSIS — Z720 Tobacco use: Secondary | ICD-10-CM | POA: Diagnosis not present

## 2015-08-15 DIAGNOSIS — Z0283 Encounter for blood-alcohol and blood-drug test: Secondary | ICD-10-CM | POA: Insufficient documentation

## 2015-08-15 DIAGNOSIS — M7918 Myalgia, other site: Secondary | ICD-10-CM

## 2015-08-15 DIAGNOSIS — Y9389 Activity, other specified: Secondary | ICD-10-CM | POA: Diagnosis not present

## 2015-08-15 DIAGNOSIS — S199XXA Unspecified injury of neck, initial encounter: Secondary | ICD-10-CM | POA: Diagnosis not present

## 2015-08-15 DIAGNOSIS — Y998 Other external cause status: Secondary | ICD-10-CM | POA: Diagnosis not present

## 2015-08-15 DIAGNOSIS — S3991XA Unspecified injury of abdomen, initial encounter: Secondary | ICD-10-CM | POA: Insufficient documentation

## 2015-08-15 DIAGNOSIS — S0001XA Abrasion of scalp, initial encounter: Secondary | ICD-10-CM | POA: Insufficient documentation

## 2015-08-15 DIAGNOSIS — Z791 Long term (current) use of non-steroidal anti-inflammatories (NSAID): Secondary | ICD-10-CM | POA: Insufficient documentation

## 2015-08-15 DIAGNOSIS — T148 Other injury of unspecified body region: Secondary | ICD-10-CM | POA: Diagnosis not present

## 2015-08-15 DIAGNOSIS — T07XXXA Unspecified multiple injuries, initial encounter: Secondary | ICD-10-CM

## 2015-08-15 DIAGNOSIS — IMO0001 Reserved for inherently not codable concepts without codable children: Secondary | ICD-10-CM

## 2015-08-15 DIAGNOSIS — T148XXA Other injury of unspecified body region, initial encounter: Secondary | ICD-10-CM

## 2015-08-15 DIAGNOSIS — S0081XA Abrasion of other part of head, initial encounter: Secondary | ICD-10-CM | POA: Insufficient documentation

## 2015-08-15 DIAGNOSIS — S3992XA Unspecified injury of lower back, initial encounter: Secondary | ICD-10-CM | POA: Insufficient documentation

## 2015-08-15 DIAGNOSIS — R911 Solitary pulmonary nodule: Secondary | ICD-10-CM | POA: Insufficient documentation

## 2015-08-15 DIAGNOSIS — Z23 Encounter for immunization: Secondary | ICD-10-CM | POA: Insufficient documentation

## 2015-08-15 HISTORY — DX: Hypothyroidism, unspecified: E03.9

## 2015-08-15 LAB — PROTIME-INR
INR: 0.96
PROTHROMBIN TIME: 13 s (ref 11.4–15.0)

## 2015-08-15 LAB — COMPREHENSIVE METABOLIC PANEL
ALT: 21 U/L (ref 17–63)
ANION GAP: 6 (ref 5–15)
AST: 28 U/L (ref 15–41)
Albumin: 4.1 g/dL (ref 3.5–5.0)
Alkaline Phosphatase: 60 U/L (ref 38–126)
BUN: 14 mg/dL (ref 6–20)
CO2: 28 mmol/L (ref 22–32)
Calcium: 8.8 mg/dL — ABNORMAL LOW (ref 8.9–10.3)
Chloride: 104 mmol/L (ref 101–111)
Creatinine, Ser: 1.25 mg/dL — ABNORMAL HIGH (ref 0.61–1.24)
Glucose, Bld: 106 mg/dL — ABNORMAL HIGH (ref 65–99)
POTASSIUM: 3.9 mmol/L (ref 3.5–5.1)
SODIUM: 138 mmol/L (ref 135–145)
Total Bilirubin: 0.4 mg/dL (ref 0.3–1.2)
Total Protein: 7.1 g/dL (ref 6.5–8.1)

## 2015-08-15 LAB — CBC WITH DIFFERENTIAL/PLATELET
BASOS PCT: 1 %
Basophils Absolute: 0.1 10*3/uL (ref 0–0.1)
Eosinophils Absolute: 0.2 10*3/uL (ref 0–0.7)
Eosinophils Relative: 2 %
HEMATOCRIT: 41.2 % (ref 40.0–52.0)
HEMOGLOBIN: 13.8 g/dL (ref 13.0–18.0)
Lymphocytes Relative: 23 %
Lymphs Abs: 2.6 10*3/uL (ref 1.0–3.6)
MCH: 30.8 pg (ref 26.0–34.0)
MCHC: 33.6 g/dL (ref 32.0–36.0)
MCV: 91.9 fL (ref 80.0–100.0)
Monocytes Absolute: 0.9 10*3/uL (ref 0.2–1.0)
Monocytes Relative: 8 %
NEUTROS ABS: 7.6 10*3/uL — AB (ref 1.4–6.5)
NEUTROS PCT: 66 %
Platelets: 141 10*3/uL — ABNORMAL LOW (ref 150–440)
RBC: 4.49 MIL/uL (ref 4.40–5.90)
RDW: 13.8 % (ref 11.5–14.5)
WBC: 11.5 10*3/uL — ABNORMAL HIGH (ref 3.8–10.6)

## 2015-08-15 LAB — ETHANOL

## 2015-08-15 LAB — APTT: APTT: 32 s (ref 24–36)

## 2015-08-15 LAB — LIPASE, BLOOD: Lipase: 23 U/L (ref 11–51)

## 2015-08-15 MED ORDER — FENTANYL CITRATE (PF) 100 MCG/2ML IJ SOLN
50.0000 ug | Freq: Once | INTRAMUSCULAR | Status: AC
  Administered 2015-08-15: 50 ug via INTRAVENOUS
  Filled 2015-08-15: qty 2

## 2015-08-15 MED ORDER — PROMETHAZINE HCL 25 MG/ML IJ SOLN
INTRAMUSCULAR | Status: AC
  Administered 2015-08-15: 25 mg via INTRAMUSCULAR
  Filled 2015-08-15: qty 1

## 2015-08-15 MED ORDER — SODIUM CHLORIDE 0.9 % IV BOLUS (SEPSIS)
500.0000 mL | Freq: Once | INTRAVENOUS | Status: AC
  Administered 2015-08-15: 500 mL via INTRAVENOUS

## 2015-08-15 MED ORDER — IOHEXOL 350 MG/ML SOLN
100.0000 mL | Freq: Once | INTRAVENOUS | Status: AC | PRN
  Administered 2015-08-15: 100 mL via INTRAVENOUS

## 2015-08-15 MED ORDER — PROMETHAZINE HCL 25 MG/ML IJ SOLN
25.0000 mg | Freq: Once | INTRAMUSCULAR | Status: AC
  Administered 2015-08-15: 25 mg via INTRAMUSCULAR

## 2015-08-15 MED ORDER — TETANUS-DIPHTH-ACELL PERTUSSIS 5-2.5-18.5 LF-MCG/0.5 IM SUSP
0.5000 mL | Freq: Once | INTRAMUSCULAR | Status: AC
  Administered 2015-08-15: 0.5 mL via INTRAMUSCULAR
  Filled 2015-08-15: qty 0.5
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
## 2015-08-15 NOTE — Discharge Instructions (Signed)
Please follow-up with a primary care physician as soon as possible for recheck. Please take Tylenol or Motrin as needed for discomfort as written on the box.    Musculoskeletal Pain Musculoskeletal pain is muscle and boney aches and pains. These pains can occur in any part of the body. Your caregiver may treat you without knowing the cause of the pain. They may treat you if blood or urine tests, X-rays, and other tests were normal.  CAUSES There is often not a definite cause or reason for these pains. These pains may be caused by a type of germ (virus). The discomfort may also come from overuse. Overuse includes working out too hard when your body is not fit. Boney aches also come from weather changes. Bone is sensitive to atmospheric pressure changes. HOME CARE INSTRUCTIONS   Ask when your test results will be ready. Make sure you get your test results.  Only take over-the-counter or prescription medicines for pain, discomfort, or fever as directed by your caregiver. If you were given medications for your condition, do not drive, operate machinery or power tools, or sign legal documents for 24 hours. Do not drink alcohol. Do not take sleeping pills or other medications that may interfere with treatment.  Continue all activities unless the activities cause more pain. When the pain lessens, slowly resume normal activities. Gradually increase the intensity and duration of the activities or exercise.  During periods of severe pain, bed rest may be helpful. Lay or sit in any position that is comfortable.  Putting ice on the injured area.  Put ice in a bag.  Place a towel between your skin and the bag.  Leave the ice on for 15 to 20 minutes, 3 to 4 times a day.  Follow up with your caregiver for continued problems and no reason can be found for the pain. If the pain becomes worse or does not go away, it may be necessary to repeat tests or do additional testing. Your caregiver may need to look  further for a possible cause. SEEK IMMEDIATE MEDICAL CARE IF:  You have pain that is getting worse and is not relieved by medications.  You develop chest pain that is associated with shortness or breath, sweating, feeling sick to your stomach (nauseous), or throw up (vomit).  Your pain becomes localized to the abdomen.  You develop any new symptoms that seem different or that concern you. MAKE SURE YOU:   Understand these instructions.  Will watch your condition.  Will get help right away if you are not doing well or get worse.   This information is not intended to replace advice given to you by your health care provider. Make sure you discuss any questions you have with your health care provider.   Document Released: 09/29/2005 Document Revised: 12/22/2011 Document Reviewed: 06/03/2013 Elsevier Interactive Patient Education [employer]
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
## 2015-08-15 NOTE — ED Notes (Signed)
Wound cleansed and dressed with gauze. Understood dc instructions

## 2015-08-31 ENCOUNTER — Encounter: Payer: Self-pay | Admitting: *Deleted

## 2015-08-31 ENCOUNTER — Emergency Department
Admission: EM | Admit: 2015-08-31 | Discharge: 2015-08-31 | Disposition: A | Discharge status: Home / Self Care | Payer: Medicaid - Out of State | Attending: Emergency Medicine | Admitting: Emergency Medicine

## 2015-08-31 ENCOUNTER — Emergency Department: Payer: Medicaid - Out of State

## 2015-08-31 DIAGNOSIS — F17210 Nicotine dependence, cigarettes, uncomplicated: Secondary | ICD-10-CM | POA: Diagnosis not present

## 2015-08-31 DIAGNOSIS — L0201 Cutaneous abscess of face: Secondary | ICD-10-CM | POA: Diagnosis not present

## 2015-08-31 DIAGNOSIS — L03211 Cellulitis of face: Secondary | ICD-10-CM | POA: Insufficient documentation

## 2015-08-31 DIAGNOSIS — F1210 Cannabis abuse, uncomplicated: Secondary | ICD-10-CM | POA: Diagnosis not present

## 2015-08-31 DIAGNOSIS — F1510 Other stimulant abuse, uncomplicated: Secondary | ICD-10-CM | POA: Diagnosis not present

## 2015-08-31 DIAGNOSIS — F1410 Cocaine abuse, uncomplicated: Secondary | ICD-10-CM | POA: Insufficient documentation

## 2015-08-31 DIAGNOSIS — L089 Local infection of the skin and subcutaneous tissue, unspecified: Secondary | ICD-10-CM | POA: Diagnosis present

## 2015-08-31 DIAGNOSIS — Z791 Long term (current) use of non-steroidal anti-inflammatories (NSAID): Secondary | ICD-10-CM | POA: Diagnosis not present

## 2015-08-31 HISTORY — DX: Bipolar disorder, unspecified: F31.9

## 2015-08-31 LAB — CBC WITH DIFFERENTIAL/PLATELET
BASOS ABS: 0.1 10*3/uL (ref 0–0.1)
Basophils Relative: 1 %
EOS ABS: 0.1 10*3/uL (ref 0–0.7)
EOS PCT: 1 %
HCT: 42.9 % (ref 40.0–52.0)
Hemoglobin: 14.6 g/dL (ref 13.0–18.0)
Lymphocytes Relative: 19 %
Lymphs Abs: 2 10*3/uL (ref 1.0–3.6)
MCH: 30.4 pg (ref 26.0–34.0)
MCHC: 33.9 g/dL (ref 32.0–36.0)
MCV: 89.7 fL (ref 80.0–100.0)
Monocytes Absolute: 0.7 10*3/uL (ref 0.2–1.0)
Monocytes Relative: 6 %
Neutro Abs: 7.8 10*3/uL — ABNORMAL HIGH (ref 1.4–6.5)
Neutrophils Relative %: 73 %
Platelets: 234 10*3/uL (ref 150–440)
RBC: 4.79 MIL/uL (ref 4.40–5.90)
RDW: 13.7 % (ref 11.5–14.5)
WBC: 10.7 10*3/uL — AB (ref 3.8–10.6)

## 2015-08-31 LAB — URINALYSIS COMPLETE WITH MICROSCOPIC (ARMC ONLY)
BILIRUBIN URINE: NEGATIVE
Bacteria, UA: NONE SEEN
GLUCOSE, UA: NEGATIVE mg/dL
Hgb urine dipstick: NEGATIVE
Ketones, ur: NEGATIVE mg/dL
Leukocytes, UA: NEGATIVE
Nitrite: NEGATIVE
PROTEIN: NEGATIVE mg/dL
Specific Gravity, Urine: 1.044 — ABNORMAL HIGH (ref 1.005–1.030)
Squamous Epithelial / LPF: NONE SEEN
pH: 7 (ref 5.0–8.0)

## 2015-08-31 LAB — COMPREHENSIVE METABOLIC PANEL
ALK PHOS: 82 U/L (ref 38–126)
ALT: 12 U/L — ABNORMAL LOW (ref 17–63)
AST: 17 U/L (ref 15–41)
Albumin: 3.8 g/dL (ref 3.5–5.0)
Anion gap: 6 (ref 5–15)
BILIRUBIN TOTAL: 0.5 mg/dL (ref 0.3–1.2)
BUN: 10 mg/dL (ref 6–20)
CALCIUM: 9.1 mg/dL (ref 8.9–10.3)
CO2: 28 mmol/L (ref 22–32)
Chloride: 108 mmol/L (ref 101–111)
Creatinine, Ser: 0.96 mg/dL (ref 0.61–1.24)
GFR calc Af Amer: >60 mL/min (ref >60)
Glucose, Bld: 115 mg/dL — ABNORMAL HIGH (ref 65–99)
Potassium: 4 mmol/L (ref 3.5–5.1)
Sodium: 142 mmol/L (ref 135–145)
TOTAL PROTEIN: 7.4 g/dL (ref 6.5–8.1)

## 2015-08-31 LAB — URINE DRUG SCREEN, QUALITATIVE (ARMC ONLY)
Amphetamines, Ur Screen: POSITIVE — AB
BARBITURATES, UR SCREEN: NOT DETECTED
BENZODIAZEPINE, UR SCRN: NOT DETECTED
CANNABINOID 50 NG, UR ~~LOC~~: POSITIVE — AB
Cocaine Metabolite,Ur ~~LOC~~: POSITIVE — AB
MDMA (Ecstasy)Ur Screen: NOT DETECTED
Methadone Scn, Ur: NOT DETECTED
Opiate, Ur Screen: NOT DETECTED
PHENCYCLIDINE (PCP) UR S: NOT DETECTED
Tricyclic, Ur Screen: NOT DETECTED

## 2015-08-31 MED ORDER — IOHEXOL 300 MG/ML  SOLN
75.0000 mL | Freq: Once | INTRAMUSCULAR | Status: AC | PRN
  Administered 2015-08-31: 75 mL via INTRAVENOUS

## 2015-08-31 MED ORDER — OXYCODONE-ACETAMINOPHEN 5-325 MG PO TABS: 1.0000 | ORAL_TABLET | Freq: Four times a day (QID) | ORAL | Status: AC | PRN

## 2015-08-31 MED ORDER — OXYCODONE-ACETAMINOPHEN 5-325 MG PO TABS
1.0000 | ORAL_TABLET | Freq: Once | ORAL | Status: AC
  Administered 2015-08-31: 1 via ORAL
  Filled 2015-08-31: qty 1

## 2015-08-31 MED ORDER — CLINDAMYCIN HCL 300 MG PO CAPS: 300.0000 mg | ORAL_CAPSULE | Freq: Three times a day (TID) | ORAL | Status: AC

## 2015-08-31 MED ORDER — HYDROMORPHONE HCL 1 MG/ML IJ SOLN
1.0000 mg | Freq: Once | INTRAMUSCULAR | Status: AC
Start: 2015-08-31 — End: 2015-08-31
  Administered 2015-08-31: 1 mg via INTRAVENOUS
  Filled 2015-08-31: qty 1

## 2015-08-31 MED ORDER — SODIUM CHLORIDE 0.9 % IV BOLUS (SEPSIS)
1000.0000 mL | Freq: Once | INTRAVENOUS | Status: AC
  Administered 2015-08-31: 1000 mL via INTRAVENOUS

## 2015-08-31 MED ORDER — MUPIROCIN CALCIUM 2 % EX CREA: TOPICAL_CREAM | CUTANEOUS | Status: AC

## 2015-08-31 MED ORDER — CLINDAMYCIN PHOSPHATE 600 MG/50ML IV SOLN
600.0000 mg | Freq: Once | INTRAVENOUS | Status: AC
  Administered 2015-08-31: 600 mg via INTRAVENOUS
  Filled 2015-08-31: qty 50

## 2015-08-31 NOTE — ED Notes (Signed)
Patient transported to CT 

## 2015-08-31 NOTE — ED Notes (Signed)
Pt verbalized understanding of discharge instructions. NAD at this time. 

## 2015-08-31 NOTE — ED Notes (Signed)
Pt reports history of staph infections, pt has left sided facial swelling with redness and a large red area to left cheek

## 2015-08-31 NOTE — Discharge Instructions (Signed)

## 2015-08-31 NOTE — ED Notes (Signed)
Asked pt for urine sample, pt states unable to give one at this time. RN notified caring for pt

## 2015-08-31 NOTE — ED Provider Notes (Signed)
[facility] Emergency Department Provider Note  ____________________________________________   I have reviewed the triage vital signs and the nursing notes.   HISTORY  Chief Complaint Recurrent Skin Infections    HPI Brett Reed is a 34 y.o. 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 Patient states he does have a history of MRSA had some on his thumb and multiple other small lesions on his body. They have all resolved but the one on his face which she has been picking at, is now more inflamed and swollen. He denies fever chills nausea vomiting change in vision or pharyngeal swelling or other complaint. Patient is requesting IV narcotics.  Past Medical History  Diagnosis Date  . Hypothyroid   . Bipolar 1 disorder (HCC)     There are no active problems to display for this patient.   History reviewed. No pertinent past surgical history.  Current Outpatient Rx  Name  Route  Sig  Dispense  Refill  . ketorolac (TORADOL) 10 MG tablet   Oral   Take 1 tablet (10 mg total) by mouth every 8 (eight) hours.   15 tablet   0     Allergies Codeine  No family history on file.  Social History Social History  Substance Use Topics  . Smoking status: Current Every Day Smoker -- 1.00 packs/day for 15 years    Types: Cigarettes  . Smokeless tobacco: None  . Alcohol Use: No    Review of Systems Constitutional: No fever/chills Eyes: No visual changes. ENT: No sore throat. No stiff neck no neck pain Cardiovascular: Denies chest pain. Respiratory: Denies shortness of breath. Gastrointestinal:   no vomiting.  No diarrhea.  No constipation. Genitourinary: Negative for dysuria. Musculoskeletal: Negative lower extremity swelling Skin: See history of present illness Neurological: Negative for headaches, focal weakness or numbness. 10-point ROS otherwise negative.  ____________________________________________   PHYSICAL  EXAM:  VITAL SIGNS: ED Triage Vitals  Enc Vitals Group     BP 08/31/15 1048 108/69 mmHg     Pulse Rate 08/31/15 1048 130     Resp 08/31/15 1048 20     Temp 08/31/15 1048 98.5 F (36.9 C)     Temp Source 08/31/15 1048 Oral     SpO2 08/31/15 1048 98 %     Weight 08/31/15 1048 180 lb (81.647 kg)     Height 08/31/15 1048  (1.88 m)     Head Cir --      Peak Flow --      Pain Score 08/31/15 1049 5     Pain Loc --      Pain Edu? --      Excl. in GC? --     Constitutional: Alert and oriented. Well appearing and in no acute distress. Eyes: Conjunctivae are normal. PERRL. EOMI. Head: Atraumatic. Nose: No congestion/rhinnorhea. Mouth/Throat: Mucous membranes are moist.  Oropharynx non-erythematous. Neck: No stridor.   Nontender with no meningismus Cardiovascular: Normal rate, regular rhythm. Grossly normal heart sounds.  Good peripheral circulation. Respiratory: Normal respiratory effort.  No retractions. Lungs CTAB. Abdominal: Soft and nontender. No distention. No guarding no rebound Back:  There is no focal tenderness or step off there is no midline tenderness there are no lesions noted. there is no CVA tenderness Musculoskeletal: No lower extremity tenderness. No joint effusions, no DVT signs strong distal pulses no edema Neurologic:  Normal speech and language. No gross focal neurologic deficits are appreciated.  Skin: There  is noted a pustule with purulent drainage but no fluctuance, there is erythema and smiled induration to the left cheek which is proximally 3 x 2 cm. There is no surrounding cellulitic changes. There are other small pustules noted. There is no evidence of significant lymphadenopathy. Psychiatric: Mood and affect are normal. Speech and behavior are normal.  ____________________________________________   LABS (all labs ordered are listed, but only abnormal results are displayed)  Labs Reviewed  CBC WITH DIFFERENTIAL/PLATELET - Abnormal; Notable for the  following:    WBC 10.7 (*)    Neutro Abs 7.8 (*)    All other components within normal limits  COMPREHENSIVE METABOLIC PANEL - Abnormal; Notable for the following:    Glucose, Bld 115 (*)    ALT 12 (*)    All other components within normal limits  URINALYSIS COMPLETEWITH MICROSCOPIC ([facility] ONLY) - Abnormal; Notable for the following:    Color, Urine YELLOW (*)    APPearance HAZY (*)    Specific Gravity, Urine 1.044 (*)    All other components within normal limits  WOUND CULTURE  URINE DRUG SCREEN, QUALITATIVE ([facility] ONLY)   ____________________________________________  EKG  I personally interpreted any EKGs ordered by me or triage  ____________________________________________  RADIOLOGY  I reviewed any imaging ordered by me or triage that were performed during my shift ____________________________________________   PROCEDURES  Procedure(s) performed: None  Critical Care performed: None  ____________________________________________   INITIAL IMPRESSION / ASSESSMENT AND PLAN / ED COURSE  Pertinent labs & imaging results that were available during my care of the patient were reviewed by me and considered in my medical decision making (see chart for details).  Patient with a facial cellulitis with some degree of induration, CT was obtained to see if it required drainage, it does not. There is a phlegmon with no evidence of abscess which is congruent with his exam. The patient has no fever or systemic illness his white count is not markedly elevated. This is therefore not isolated cellulitis of the face. Discussed with ENT on call, who do not feel the patient requires admission for this. We'll give him IV clindamycin and oral clindamycin. I have stressed to the patient that he does need to be compliant. He is demanding narcotic pain medication on a regular basis here despite receiving Dilaudid. I will not give him more IV pain medication we'll transition him to oral pain  medication will try to give him discharge. I have advised that he return to the emergency room for close follow-up. ____________________________________________   FINAL CLINICAL IMPRESSION(S) / ED DIAGNOSES  Final diagnoses:  Facial abscess     Jeanmarie PlantJames A McShane, MD 08/31/15 1427

## 2015-09-03 LAB — WOUND CULTURE
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
## 2019-06-07 ENCOUNTER — Emergency Department
Admission: EM | Admit: 2019-06-07 | Discharge: 2019-06-07 | Disposition: A | Discharge status: Home / Self Care | Payer: Medicaid Other | Attending: Emergency Medicine | Admitting: Emergency Medicine

## 2019-06-07 ENCOUNTER — Encounter (HOSPITAL_COMMUNITY): Payer: Self-pay

## 2019-06-07 ENCOUNTER — Other Ambulatory Visit: Payer: Self-pay

## 2019-06-07 ENCOUNTER — Emergency Department (HOSPITAL_COMMUNITY): Payer: Medicaid Other | Admitting: Radiology

## 2019-06-07 DIAGNOSIS — L02213 Cutaneous abscess of chest wall: Secondary | ICD-10-CM

## 2019-06-07 DIAGNOSIS — L0291 Cutaneous abscess, unspecified: Secondary | ICD-10-CM | POA: Insufficient documentation

## 2019-06-07 DIAGNOSIS — F17210 Nicotine dependence, cigarettes, uncomplicated: Secondary | ICD-10-CM | POA: Insufficient documentation

## 2019-06-07 MED ORDER — SULFAMETHOXAZOLE 800 MG-TRIMETHOPRIM 160 MG TABLET
1.00 | ORAL_TABLET | Freq: Two times a day (BID) | ORAL | 0 refills | Status: AC
Start: 2019-06-07 — End: 2019-06-17

## 2019-06-07 NOTE — Procedures (Signed)
Encounter Date: 06/07/2019     Emergency Department Procedure:    I&D Abscess  Procedure performed at: 0370  Pre-Procedure Documentation: Skin was prepped and draped  Skin was prepped with: Betadine  Local Anesthesia: Lidocaine Plain  Description:: Single simple abscess  Fluid was grossly: Bloody  Incision with: 11 blade  Documentation: Evaluated for loculations  Ultrasound Guidance Required?: No  Attending Physician: Richelle Ito, FNP-BC  06/07/2019, 18:38

## 2019-06-07 NOTE — ED Provider Notes (Signed)
Department of [facility]  HPI - 06/07/2019      Advanced Practice Provider: Tracey Harries, FNP-BC  Attending Physician: Dr. Calvert Cantor    Chief Complaint: Abscess    HPI  Cloyd Ragas is a 38 y.o. male who presents to the ED via POV with c/o abscess. The patient reports that he has had an abscess on his left chest which has been intermittent for a few years, but over the last few days his pain and redness has worsened. 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 He denies any fevers, chills, nausea, or vomiting. Patient denies any other complaints or concerns at this time.     History Limitations: None      Review of Systems  Constitutional: No fever, chills or weakness   Skin: No rash or diaphoresis +Abscess  HENT: No headaches or congestion  Eyes: No vision changes   Cardio: No chest pain, palpitations or leg swelling   Respiratory: No cough, wheezing or SOB  GI:  No abdominal pain, nausea, vomiting or stool changes  GU:  No urinary changes  MSK: No joint or back pain  Neuro: No seizures or LOC  All other systems reviewed and are negative.    History:   PMH:  No past medical history on file.    PSH:    Past Surgical History Pertinent Negatives:   Procedure Date Noted   . CORONARY ARTERY ANGIOPLASTY 01/07/2013   . HX ADENOIDECTOMY 01/07/2013   . HX APPENDECTOMY 01/07/2013   . HX CESAREAN SECTION 01/07/2013   . HX CHOLECYSTECTOMY 01/07/2013   . HX HERNIA REPAIR 01/07/2013   . HX HYSTERECTOMY 01/07/2013   . HX TONSILLECTOMY 01/07/2013         Social Hx:    Social History     Socioeconomic History   . Marital status: Single     Spouse name: Not on file   . Number of children: Not on file   . Years of education: Not on file   . Highest education level: Not on file   Occupational History   . Not on file   Social Needs   . Financial resource strain: Not on file   . Food insecurity     Worry: Not on file     Inability: Not on file   .  Transportation needs     Medical: Not on file     Non-medical: Not on file   Tobacco Use   . Smoking status: Current Every Day Smoker     Packs/day: 1.00     Types: Cigarettes   Substance and Sexual Activity   . Alcohol use: Yes     Comment: rarely   . Drug use: No   . Sexual activity: Not on file   Lifestyle   . Physical activity     Days per week: Not on file     Minutes per session: Not on file   . Stress: Not on file   Relationships   . Social Product manager on phone: Not on file     Gets together: Not on file     Attends religious service: Not on file     Active member of club or organization: Not on file     Attends meetings of clubs or organizations: Not on file     Relationship status: Not on file   . Intimate partner violence  Fear of current or ex partner: Not on file     Emotionally abused: Not on file     Physically abused: Not on file     Forced sexual activity: Not on file   Other Topics Concern   . Not on file   Social History Narrative   . Not on file     Family Hx: No family history on file.  Allergies: No Known Allergies  Medications:   Cannot display prior to admission medications because the patient has not been admitted in this contact.       Above history reviewed with patient, changes are as documented.    Physical Exam   Nursing notes reviewed.    Filed Vitals:    06/07/19 1614   BP: 128/72   Pulse: 98   Resp: 18   Temp: 36.5 C (97.7 F)   SpO2: 98%      Constitutional: NAD. Oriented  HENT:   Head: Normocephalic and atraumatic.  Eyes: EOMI, PERRL   Neck: Trachea midline. Neck supple.  Cardiovascular:  Intact distal pulses.  Pulmonary/Chest: No respiratory distress.   Musculoskeletal: No edema, tenderness or deformity.  Skin: Warm and dry. 6 cm abscess to the left chest with TTP and erythema. No rash, pallor or cyanosis  Psychiatric: Normal mood and affect. Behavior is normal.   Neurological: Alert. Grossly intact.    Course  Orders, Abnormal Labs and Imaging Results:  Results up to  the Time the Disposition was Entered - No data to display    EKG:   MDM:   ED Course as of Jun 07 942   Tue Jun 07, 2019   1746 US SUPERFICIAL SOFT TISSUE [JC]      ED Course User Index  [JC] Clinton Sawyerarder, Mellony Danziger, FNP-BC     Therapy/Procedures/Course/MDM:    Patient was vitally stable throughout visit.      Imaging: As above, reviewed by me     Lab results:    Patient received: PO keflex   Results discussed with patient.     Advised the patient return to the ED if patient begins to develop any worsening symptoms.   Pt voiced understanding and was agreeable to the plan   he was given the opportunity to ask questions.      ED Course as of Jun 07 942   Tue Jun 07, 2019   1746 US SUPERFICIAL SOFT TISSUE [JC]      ED Course User Index  [JC] Clinton SawyerCarder, Woodie Degraffenreid, FNP-BC         Consults: none indicated  Impression: No diagnosis found.  Disposition:  Data Unavailable    Discharge: Following the above history, physical exam, and studies, the patient was deemed stable and suitable for discharge. Patient will be discharged with Keflex and will follow up with PCP. Discharge medications and follow-up instructions were discussed with patient/patient's family. It was advised that the patient return to the ED if they develop new or any other concerning symptoms. The patient/patient's family verbalized understanding of all instructions and had no further questions or concerns.    Follow Up:   No follow-up provider specified.  Prescriptions:   Current Discharge Medication List           No future appointments.    The co-signing faculty was physically present in the emergency department and available for consultation and did not participate in the care of this patient.    I am scribing for, and in the presence of, Shanda BumpsJessica  Anntonette Madewell, FNP-BC for services provided on 06/07/2019.  Selinda MichaelsElyssa Zicha, SCRIBE   Elyssa Karie SchwalbeZicha, SCRIBE  06/07/2019, 16:34    I personally performed the services described in this documentation, as scribed  in my presence,  and it is both accurate  and complete.    Clinton SawyerJessica Shrika Milos, FNP-BC

## 2019-06-07 NOTE — Discharge Instructions (Signed)
Please keep wound clean and dry.  Take medications as prescribed.  Return for new or worsening symptoms.

## 2020-07-02 ENCOUNTER — Emergency Department
Admission: EM | Admit: 2020-07-02 | Discharge: 2020-07-02 | Disposition: A | Discharge status: Home / Self Care | Payer: Medicaid Other | Attending: Emergency Medicine | Admitting: Emergency Medicine

## 2020-07-02 ENCOUNTER — Other Ambulatory Visit: Payer: Self-pay

## 2020-07-02 DIAGNOSIS — F17210 Nicotine dependence, cigarettes, uncomplicated: Secondary | ICD-10-CM

## 2020-07-02 DIAGNOSIS — L237 Allergic contact dermatitis due to plants, except food: Secondary | ICD-10-CM | POA: Insufficient documentation

## 2020-07-02 NOTE — Discharge Instructions (Signed)
Over the counter benadryl 25-50 mg every 6-8 hours as needed for itching  Over the counter hydrocortisone topical ointment

## 2020-07-02 NOTE — ED Provider Notes (Signed)
[facility]  Emergency Department  Attending Note    Identification:  Name: Derek Randall  Age and Gender: 39 y.o. male  Date of Birth: 1981-04-21  Date of Service: 07/02/2020  MRN: [id number]  PCP: No Pcp    Code Status: full    Arrival: The patient arrived by private car and is alone.    History Obtained from: Patient. EMR.     History Limitations: none    CC:  Chief Complaint   Patient presents with   . Poison Ivy     Pt arrives ambulatory to ED c/o poison ivy for past 4 days no improvement     HPI:  Derek Randall is a 39 y.o. White male presenting with poison ivy.  Patient has been having rash for approximately 4 days.  He states he has been taking Benadryl with mild relief of itching.  Patient come to the emergency department for further evaluation and management.  No systemic symptoms..    ROS:  Constitutional: - fever, - chills, - weakness   Skin: + rash, - diaphoresis  HENT: - headaches, - congestion, - sore throat, - epistaxis  Eyes: - vision changes   Cardiovascular: - chest pain, - palpitations, - edema  Respiratory: - cough, - wheezing, - dyspnea  GI: - nausea, - vomiting, - diarrhea, - constipation, - abdominal pain  GU: - dysuria, - hematuria, - polyuria  MSK: - joint pain, - back pain, - neck pain  Neuro: - loss of sensation, - confusion, - focal deficits, - paresthesias  Psychiatric: - mood changes. - SI/HI/AVH.   All other systems reviewed and are negative.     Medications:  Medications reviewed with patient/patient's family.  Medications Prior to Admission     None        Allergies:  Allergies reviewed with patient/patient's family.  No Known Allergies    PMSFSH:  Past medical, surgical, family and social history reviewed with patient/patient's family.  No past medical history on file.  No past surgical history on file.  No family history on file.  Social History     Socioeconomic History   . Marital status: Single     Spouse name: Not on file   . Number of children: Not on file   . Years of  education: Not on file   . Highest education level: Not on file   Occupational History   . Not on file   Tobacco Use   . Smoking status: Current Every Day Smoker     Packs/day: 1.00     Types: Cigarettes   . Smokeless tobacco: Never Used   Substance and Sexual Activity   . Alcohol use: Yes     Comment: rarely   . Drug use: No   . Sexual activity: Not on file   Other Topics Concern   . Not on file   Social History Narrative   . Not on file     Social Determinants of Health     Financial Resource Strain:    . Difficulty of Paying Living Expenses:    Food Insecurity:    . Worried About Programme researcher, broadcasting/film/video in the Last Year:    . Barista in the Last Year:    Transportation Needs:    . Freight forwarder (Medical):    Marland Kitchen Lack of Transportation (Non-Medical):    Physical Activity:    . Days of Exercise per Week:    . Minutes  of Exercise per Session:    Stress:    . Feeling of Stress :    Intimate Partner Violence:    . Fear of Current or Ex-Partner:    . Emotionally Abused:    Marland Kitchen Physically Abused:    . Sexually Abused:        Pertinent Physical Exam:   All nurse's notes reviewed.  BP (!) 143/99   Pulse 99   Temp 36.9 C (98.4 F)   Resp 20   Ht 1.88 m (6\' 2" )   Wt 94.3 kg (207 lb 14.3 oz)   SpO2 96%   BMI 26.69 kg/m   Constitutional: NAD. A&Ox3. Good historian.   Head: NC/AT. MMM. PERRLA. EOMI.  Lungs: No distress.  Musculoskeletal: No deformity or edema.  Skin: Warm and dry. No erythema, jaundice, cyanosis.  Linear vesicular rash to dorsum of left hand and right calf.  No sign of infection.  Neurologic: CNs grossly intact. Neurovascularly intact bilaterally. No focal neurological deficits.  Psychiatric: Normal affect and mood.    Orders:  Results up to the Time the Disposition was Entered - No data to display    Course:   Patient seen and examined.  Vital signs stable and within normal limits.  Physical exam as above.  Given the extent of the contact dermatitis from poison ivy, systemic steroids not  recommended.  Over-the-counter Benadryl and topical hydrocortisone recommend to the patient.  Patient discharged home with the above plan.    Consults:   None     Impression:   Encounter Diagnosis   Name Primary?   . Poison ivy Yes     Disposition:    Discharged. Patient/patient's family was advised to return to the ED with any new, concerning or worsening symptoms and follow up as directed. The patient/patient's family verbalized understanding of all instructions and had no further questions or concerns.     , MD  07/02/2020, 11:26   [facility]    Parts of this patient's chart was completed in a retrospective fashion due to simultaneous direct patient care in the Emergency Department.   This note was partially generated using MModal Fluency Direct system, and there may be some incorrect syntax, spelling, and punctuation that were not noted before saving. In such instances, original meaning may be extrapolated by contextual derivation.

## 2020-08-09 ENCOUNTER — Emergency Department (HOSPITAL_COMMUNITY): Payer: Medicaid Other

## 2020-08-09 ENCOUNTER — Emergency Department
Admission: EM | Admit: 2020-08-09 | Discharge: 2020-08-09 | Disposition: A | Discharge status: Home / Self Care | Payer: Medicaid Other | Attending: Family | Admitting: Family

## 2020-08-09 ENCOUNTER — Other Ambulatory Visit: Payer: Self-pay

## 2020-08-09 DIAGNOSIS — Z8619 Personal history of other infectious and parasitic diseases: Secondary | ICD-10-CM | POA: Insufficient documentation

## 2020-08-09 DIAGNOSIS — L739 Follicular disorder, unspecified: Secondary | ICD-10-CM | POA: Insufficient documentation

## 2020-08-09 DIAGNOSIS — F17210 Nicotine dependence, cigarettes, uncomplicated: Secondary | ICD-10-CM | POA: Insufficient documentation

## 2020-08-09 MED ORDER — SULFAMETHOXAZOLE 800 MG-TRIMETHOPRIM 160 MG TABLET
1.00 | ORAL_TABLET | Freq: Two times a day (BID) | ORAL | 0 refills | Status: AC
Start: 2020-08-09 — End: 2020-08-19

## 2020-08-09 MED ORDER — CEPHALEXIN 500 MG CAPSULE
500.00 mg | ORAL_CAPSULE | Freq: Four times a day (QID) | ORAL | 0 refills | Status: AC
Start: 2020-08-09 — End: 2020-08-19

## 2020-08-09 NOTE — ED Provider Notes (Signed)
Department of Emergency Medicine  [facility]  HPI - 08/09/2020      Advanced Practice Provider: Diamantina Monks, APRN-BC  Attending Physician: Dr. Tyler Pita    Chief Complaint: Sore    HPI  Derek Randall is a 39 y.o. male who presents to the ED via POV with c/o sore. Patient reports multiple sores to his chin, noting he scratched this area and the sores have been spreading with intermittent drainage. He notes h/o recurrent staph infections and reports recent staph exposure.    He denies fevers, chills, and any other complaints or concerns at this time.      History Limitations: None    Review of Systems  Constitutional: No fever, chills or weakness   Skin: No rash or diaphoresis +Sore  HENT: No headaches or congestion  Eyes: No vision changes   Cardio: No chest pain, palpitations or leg swelling   Respiratory: No cough, wheezing or SOB  GI:  No abdominal pain, nausea, vomiting or stool changes  GU:  No urinary changes  MSK: No joint or back pain  Neuro: No seizures or LOC  All other systems reviewed and are negative.    History:   PMH:  No past medical history on file.    PSH:    Past Surgical History Pertinent Negatives:   Procedure Date Noted    CORONARY ARTERY ANGIOPLASTY 01/07/2013    HX ADENOIDECTOMY 01/07/2013    HX APPENDECTOMY 01/07/2013    HX CESAREAN SECTION 01/07/2013    HX CHOLECYSTECTOMY 01/07/2013    HX HERNIA REPAIR 01/07/2013    HX HYSTERECTOMY 01/07/2013    HX TONSILLECTOMY 01/07/2013         Social Hx:    Social History     Socioeconomic History    Marital status: Single     Spouse name: Not on file    Number of children: Not on file    Years of education: Not on file    Highest education level: Not on file   Occupational History    Not on file   Tobacco Use    Smoking status: Current Every Day Smoker     Packs/day: 1.00     Types: Cigarettes    Smokeless tobacco: Never Used   Substance and Sexual Activity    Alcohol use: Yes     Comment: rarely    Drug use: No    Sexual activity: Not on  file   Other Topics Concern    Not on file   Social History Narrative    Not on file     Social Determinants of Health     Financial Resource Strain:     Difficulty of Paying Living Expenses:    Food Insecurity:     Worried About Programme researcher, broadcasting/film/video in the Last Year:     Barista in the Last Year:    Transportation Needs:     Freight forwarder (Medical):     Lack of Transportation (Non-Medical):    Physical Activity:     Days of Exercise per Week:     Minutes of Exercise per Session:    Stress:     Feeling of Stress :    Intimate Partner Violence:     Fear of Current or Ex-Partner:     Emotionally Abused:     Physically Abused:     Sexually Abused:      Family Hx: No family history on file.  Allergies:  No Known Allergies  Medications:   None       Above history reviewed with patient, changes are as documented.    Physical Exam   Nursing notes reviewed.    Filed Vitals:    08/09/20 0950   BP: 134/88   Pulse: (!) 108   Resp: 20   Temp: 36.7 C (98.1 F)   SpO2: 96%      Constitutional: NAD. Oriented  HENT:   Head: Normocephalic and atraumatic.  Eyes: EOMI, PERRL   Neck: Trachea midline. Neck supple.  Cardiovascular:  Intact distal pulses.  Pulmonary/Chest: No respiratory distress.   Musculoskeletal: No edema, tenderness or deformity.  Skin: Folliculitis to R lateral chin with mild erythema, swelling, and induration. No purulent drainage noted. Warm and dry. No rash, pallor or cyanosis  Psychiatric: Normal mood and affect. Behavior is normal.   Neurological: Alert. Grossly intact.    Course  Orders, Abnormal Labs and Imaging Results:  Results up to the Time the Disposition was Entered - No data to display    MDM:   Therapy/Procedures/Course/MDM:   Patient was vitally stable throughout visit.   Imaging: None indicated.   Lab results: None indicated.   Results discussed with patient.    Advised the patient return to the ED if patient begins to develop any worsening symptoms.  Pt voiced understanding and was  agreeable to the plan  he was given the opportunity to ask questions.             Consults: none indicated  Impression:   Encounter Diagnosis   Name Primary?    Folliculitis Yes     Disposition:  Discharged    Discharge: Following the above history, physical exam, and studies, the patient was deemed stable and suitable for discharge. Patient was discharged with Bactrim and Keflex and will return to [facility] ED as needed. Discharge medications and follow-up instructions were discussed with patient/patient's family. It was advised that the patient return to the ED if they develop new or any other concerning symptoms. The patient/patient's family verbalized understanding of all instructions and had no further questions or concerns.    Follow Up:   No follow-up provider specified.  Prescriptions:   Current Discharge Medication List        START taking these medications    Details   cephalexin (KEFLEX) 500 mg Oral Capsule Take 1 Capsule (500 mg total) by mouth Four times a day for 10 days  Qty: 40 Capsule, Refills: 0      trimethoprim-sulfamethoxazole (BACTRIM DS) 160-800mg  per tablet Take 1 Tablet (160 mg total) by mouth Every 12 hours for 10 days  Qty: 20 Tablet, Refills: 0              No future appointments.    The co-signing faculty was physically present in the emergency department and available for consultation and did not participate in the care of this patient.    I am scribing for, and in the presence of, Diamantina Monks, APRN, FNP-BC for services provided on 08/09/2020.  Maggie Petitto, SCRIBE   Maggie Petitto, SCRIBE  08/09/2020, 10:06    I personally performed the services described in this documentation, as scribed  in my presence, and it is both accurate  and complete.    Diamantina Monks, APRN,FNP-BC

## 2021-03-14 ENCOUNTER — Emergency Department
Admission: EM | Admit: 2021-03-14 | Discharge: 2021-03-14 | Disposition: A | Discharge status: Home / Self Care | Payer: Medicaid Other | Attending: Family | Admitting: Family

## 2021-03-14 ENCOUNTER — Emergency Department (HOSPITAL_COMMUNITY): Payer: Medicaid Other

## 2021-03-14 ENCOUNTER — Other Ambulatory Visit: Payer: Self-pay

## 2021-03-14 DIAGNOSIS — Z20822 Contact with and (suspected) exposure to covid-19: Secondary | ICD-10-CM | POA: Insufficient documentation

## 2021-03-14 DIAGNOSIS — F17210 Nicotine dependence, cigarettes, uncomplicated: Secondary | ICD-10-CM | POA: Insufficient documentation

## 2021-03-14 DIAGNOSIS — R0981 Nasal congestion: Secondary | ICD-10-CM

## 2021-03-14 DIAGNOSIS — Z6826 Body mass index (BMI) 26.0-26.9, adult: Secondary | ICD-10-CM

## 2021-03-14 DIAGNOSIS — J069 Acute upper respiratory infection, unspecified: Secondary | ICD-10-CM

## 2021-03-14 DIAGNOSIS — J302 Other seasonal allergic rhinitis: Secondary | ICD-10-CM

## 2021-03-14 DIAGNOSIS — J309 Allergic rhinitis, unspecified: Secondary | ICD-10-CM | POA: Insufficient documentation

## 2021-03-14 LAB — COVID-19, FLU A/B, RSV RAPID BY PCR
INFLUENZA VIRUS TYPE A: NOT DETECTED
INFLUENZA VIRUS TYPE B: NOT DETECTED
RESPIRATORY SYNCTIAL VIRUS (RSV): NOT DETECTED
SARS-CoV-2: NOT DETECTED

## 2021-03-14 NOTE — ED Provider Notes (Signed)
Department of Emergency Medicine  HPI - 03/14/2021    Advanced Practice Provider: Stark Bray, APRN  Attending Physician: Dr. Glendell Docker, MD    Chief Complaint: nasal congestion    HPI  Derek Randall is a 40 y.o. male who presents to the ED via POV with c/o nasal congestion x2 weeks with no improvement. He reports associated cough, fevers, and vomiting. Pt states he's been taking Mucinex at home, which has been providing some relief. Pt endorses daily cigarette smoking. He denies any h/o asthma.  He reports he did receive his COVID vaccine previously.  All other associated symptoms or concerns denied at this time.      History Limitations: none      Review of Systems  Constitutional: No chills or weakness +fever   Skin: No rash or diaphoresis  HENT: No headaches +congestion  Eyes: No vision changes   Cardio: No chest pain, palpitations or leg swelling   Respiratory: No wheezing or SOB +cough   GI:  No abdominal pain, nausea or stool changes +vomiting   GU:  No urinary changes  MSK: No joint or back pain  Neuro: No seizures or LOC    All other systems reviewed and are negative.    History:   PMH:  No past medical history on file.      PSH:  none      Social Hx:    Social History     Socioeconomic History   . Marital status: Single     Spouse name: Not on file   . Number of children: Not on file   . Years of education: Not on file   . Highest education level: Not on file   Occupational History   . Not on file   Tobacco Use   . Smoking status: Current Every Day Smoker     Packs/day: 1.00     Types: Cigarettes   . Smokeless tobacco: Never Used   Substance and Sexual Activity   . Alcohol use: Yes     Comment: rarely   . Drug use: No   . Sexual activity: Not on file   Other Topics Concern   . Not on file   Social History Narrative   . Not on file     Social Determinants of Health     Financial Resource Strain: Not on file   Food Insecurity: Not on file   Transportation Needs: Not on file   Physical Activity: Not on  file   Stress: Not on file   Intimate Partner Violence: Not on file   Housing Stability: Not on file     Family Hx: No family history on file.  Allergies: No Known Allergies  Medications:   None       Above history reviewed with patient, changes are as documented.    Physical Exam   Nursing notes reviewed.    Filed Vitals:    03/14/21 1159   BP: 114/87   Pulse: 97   Resp: 19   Temp: 36.6 C (97.9 F)   SpO2: 100%      Constitutional: NAD. Oriented  HENT:   Head: Normocephalic and atraumatic.   Mouth/Throat: Mild posterior oropharynx drainage.   Eyes: EOMI, PERRL   Ears: Bilateral TM clear, pearly gray.   Neck: Trachea midline. Neck supple.  Cardiovascular: Regular rate and rhythm. No murmurs, rubs or gallops. Intact distal pulses.  Pulmonary/Chest: BS equal bilaterally. No respiratory distress. No wheezes, rales or chest tenderness.  GI: BS +. Abdomen soft, no tenderness, rebound or guarding.         Musculoskeletal: No edema, tenderness or deformity.  Skin: Warm and dry. No rash, erythema, pallor or cyanosis  Psychiatric: Normal mood and affect. Behavior is normal.   Neurological: Alert. Grossly intact.      Course  Orders, Abnormal Labs and Imaging Results:  Results up to the Time the Disposition was Entered   COVID-19, FLU A/B, RSV RAPID BY PCR - Normal    Narrative:     Results are for the simultaneous qualitative identification of SARS-CoV-2 (formerly 2019-nCoV), Influenza A, Influenza B, and RSV RNA. These etiologic agents are generally detectable in nasopharyngeal and nasal swabs during the ACUTE PHASE of infection. Hence, this test is intended to be performed on respiratory specimens collected from individuals with signs and symptoms of upper respiratory tract infection who meet Centers for Disease Control and Prevention (CDC) clinical and/or epidemiological criteria for Coronavirus Disease 2019 (COVID-19) testing. CDC COVID-19 criteria for testing on human specimens is available at [facility] webpage  information for Healthcare Professionals: Coronavirus Disease 2019 (COVID-19) ([employer]).     False-negative results may occur if the virus has genomic mutations, insertions, deletions, or rearrangements or if performed very early in the course of illness. Otherwise, negative results indicate virus specific RNA targets are not detected, however negative results do not preclude SARS-CoV-2 infection/COVID-19, Influenza, or Respiratory syncytial virus infection. Results should not be used as the sole basis for patient management decisions. Negative results must be combined with clinical observations, patient history, and epidemiological information. If upper respiratory tract infection is still suspected based on exposure history together with other clinical findings, re-testing should be considered.    Disclaimer:   This assay has been authorized by FDA under an Emergency Use Authorization for use in laboratories certified under the Clinical Laboratory Improvement Amendments of 1988 (CLIA), 42 U.S.C. [phone], to perform high complexity tests. The impacts of vaccines, antiviral therapeutics, antibiotics, chemotherapeutic or immunosuppressant drugs have not been evaluated.     Test methodology:   Cepheid Xpert Xpress SARS-CoV-2/Flu/RSV Assay real-time polymerase chain reaction (RT-PCR) test on the GeneXpert Dx and Xpert Xpress systems.   XR AP MOBILE CHEST    Narrative:     Derek Randall    PROCEDURE DESCRIPTION: XR AP MOBILE CHEST    CLINICAL INDICATION: cough, URI    TECHNIQUE: 1 views / 1 images submitted.    COMPARISON: 04/16/2012.      FINDINGS:   The mediastinum and cardiac silhouette are not enlarged.  There is no pneumothorax or pleural effusion.  There is no pulmonary infiltrate or atelectasis.       MDM:   ED Course as of 03/14/21 1329   Thu Mar 14, 2021   1212 CC: sinus congestion x 2 weeks.  [ML]   1237 XR AP MOBILE CHEST  IMPRESSION:  No infiltrate. [ML]   1313 SARS  CORONAVIRUS 2 (SARS-CoV-2): Not Detected [ML]   1313 INFLUENZA VIRUS TYPE A: Not Detected [ML]   1313 INFLUENZA VIRUS TYPE B: Not Detected [ML]   1313 RSV PCR: Not Detected [ML]   1315 Reviewed results with patient.  Differential etiologies discussed including but not limited to:  Viral versus bacterial versus allergic etiology.  Symptomatic treatment discussed:  Increase fluids, rest, Flonase, Claritin over-the-counter as directed on package.  Follow-up with PCP.  Return to ED precautions discussed.  Patient verbalizes understanding and is in agreement plan of care. [ML]  ED Course User Index  [ML] Stark Bray, APRN      Presents with nasal congestion.    Pt was vitally stable on arrival.    Pt received: none indicated.    Imaging: as reviewed above by me.    Labs: as reviewed above by me.    Results discussed with pt.    Plan: DC home to follow with PCP, recommend OTC Flonase and Claritin.    Pt is agreeable with plan.    Pt was given the opportunity to ask questions and denies any questions/concerns at this time.     Impression:   Encounter Diagnoses   Name Primary?   . Nasal congestion Yes   . Upper respiratory tract infection, unspecified type    . Seasonal allergic rhinitis      Disposition:  Discharged  Discharge: Following the above history, physical exam, and studies, the patient was deemed stable and suitable for discharge.  It was advised that the patient return to the ED if they develop new or any other concerning symptoms and follow up as directed.   The patient verbalized understanding of all instructions and had no further questions or concerns.  Follow Up:   [facility] - Emergency Department  [address]  [place] [address]  [phone]    As needed, If symptoms worsen      I am scribing for, and in the presence of, Stark Bray, APRN FNP-BC for services provided on 03/14/2021.  Sandi Carne, SCRIBE   Sandi Carne, SCRIBE  03/14/2021, 12:14.    I  personally performed the services described in this documentation, as scribed  in my presence, and it is both accurate  and complete.    Stark Bray, APRN FNP-BC      The co-signing faculty was physically present in the emergency department and available for consultation and did not participate in the care of this patient.

## 2022-01-16 ENCOUNTER — Emergency Department
Admission: EM | Admit: 2022-01-16 | Discharge: 2022-01-16 | Disposition: A | Discharge status: Home / Self Care | Payer: Medicaid Other | Attending: Family | Admitting: Family

## 2022-01-16 ENCOUNTER — Emergency Department (HOSPITAL_COMMUNITY): Payer: Medicaid Other

## 2022-01-16 DIAGNOSIS — Z6826 Body mass index (BMI) 26.0-26.9, adult: Secondary | ICD-10-CM

## 2022-01-16 DIAGNOSIS — L02416 Cutaneous abscess of left lower limb: Secondary | ICD-10-CM | POA: Insufficient documentation

## 2022-01-16 DIAGNOSIS — Z8614 Personal history of Methicillin resistant Staphylococcus aureus infection: Secondary | ICD-10-CM | POA: Insufficient documentation

## 2022-01-16 DIAGNOSIS — F17210 Nicotine dependence, cigarettes, uncomplicated: Secondary | ICD-10-CM | POA: Insufficient documentation

## 2022-01-16 MED ORDER — CEPHALEXIN 500 MG CAPSULE
500.0000 mg | ORAL_CAPSULE | Freq: Four times a day (QID) | ORAL | 0 refills | Status: AC
Start: 2022-01-16 — End: 2022-01-26

## 2022-01-16 MED ORDER — SULFAMETHOXAZOLE 800 MG-TRIMETHOPRIM 160 MG TABLET
1.0000 | ORAL_TABLET | Freq: Two times a day (BID) | ORAL | 0 refills | Status: AC
Start: 2022-01-16 — End: 2022-01-26

## 2022-01-16 NOTE — ED Provider Notes (Signed)
Department of Emergency Medicine  [facility]  HPI - 01/16/2022      Advanced Practice Provider: Diamantina Monks, FNP-BC  Attending Physician: Dr. Gates Rigg    Chief Complaint: Skin Ulcer, Sinus Congestion    HPI  Derek Randall is a 41 y.o. male who presents to the ED via POV with c/o skin ulcer and sinus congestion. Patient complains of a skin ulcer to his L hip that he first noticed 2 months ago. He states his tool belt for work rubs this area, causing it to worsen. Patient additionally complains of drainage form this area, he endorses a h/o MRSA.    Patient additionally complains of recent sinus congestion.     The patient denies any other complaints or concerns at this time.    History Limitations: None    Review of Systems  Constitutional: No fever, chills or weakness   Skin: No rash or diaphoresis +Skin ulcer  HENT: No headaches +Sinus congestion  Eyes: No vision changes   Cardio: No chest pain, palpitations or leg swelling   Respiratory: No cough, wheezing or SOB  GI:  No abdominal pain, nausea, vomiting or stool changes  GU:  No urinary changes  MSK: No joint or back pain  Neuro: No seizures, dizziness, or LOC  All other systems reviewed and are negative.    History:   PMH:  No past medical history on file.    PSH:    Past Surgical History Pertinent Negatives:   Procedure Date Noted   . CORONARY ARTERY ANGIOPLASTY 01/07/2013   . HX ADENOIDECTOMY 01/07/2013   . HX APPENDECTOMY 01/07/2013   . HX CESAREAN SECTION 01/07/2013   . HX CHOLECYSTECTOMY 01/07/2013   . HX HERNIA REPAIR 01/07/2013   . HX HYSTERECTOMY 01/07/2013   . HX TONSILLECTOMY 01/07/2013         Social Hx:    Social History     Socioeconomic History   . Marital status: Single     Spouse name: Not on file   . Number of children: Not on file   . Years of education: Not on file   . Highest education level: Not on file   Occupational History   . Not on file   Tobacco Use   . Smoking status: Every Day     Packs/day: 1.00     Types: Cigarettes   .  Smokeless tobacco: Never   Substance and Sexual Activity   . Alcohol use: Yes     Comment: rarely   . Drug use: No   . Sexual activity: Not on file   Other Topics Concern   . Not on file   Social History Narrative   . Not on file     Social Determinants of Health     Financial Resource Strain: Not on file   Transportation Needs: Not on file   Social Connections: Not on file   Intimate Partner Violence: Not on file   Housing Stability: Not on file     Family Hx: No family history on file.  Allergies: No Known Allergies  Medications:   None       Above history reviewed with patient, changes are as documented.    Physical Exam   Nursing notes reviewed.    Filed Vitals:    01/16/22 1103   BP: 136/80   Pulse: 83   Resp: 18   Temp: 36.6 C (97.8 F)   SpO2: 97%      Constitutional: NAD.  Oriented.  HENT:   Head: Normocephalic and atraumatic.   Mouth/Throat: Oropharynx is clear and moist.   Eyes: EOMI, PERRLA.   Neck: Trachea midline. Neck supple. No lymphadenopathy.   Cardiovascular: Regular rate and rhythm. No murmurs, rubs or gallops. Intact distal pulses.  Pulmonary/Chest: Normal work of breathing, no respiratory distress. CTAB without wheezing, rhonchi, crackles  GI: BS +. Abdomen soft, non distended. No tenderness, rebound, or guarding.    Musculoskeletal: No tenderness, or deformity.  Skin: Small abscess noted to L lateral hip, mild erythema and edema. Non-fluctuant. Warm and dry. No pallor or cyanosis. No rash.  Psychiatric: Normal mood and affect. Behavior is normal.   Neurological: Alert. Grossly intact.    Course  Orders, Abnormal Labs and Imaging Results:  Results up to the Time the Disposition was Entered - No data to display      Medical Decision Making  Abscess of left hip: acute illness or injury  Risk  Prescription drug management.             Consults: none indicated  Impression:   Clinical Impression   Abscess of left hip (Primary)     Disposition:     Discharged    Discharge: Following the above history,  physical exam, and studies, the patient was deemed stable and suitable for discharge. Patient was discharged with Bactrim and Keflex and will return to [facility] ED as needed. Discharge medications and follow-up instructions were discussed with patient/patient's family. It was advised that the patient return to the ED if they develop new or any other concerning symptoms. The patient/patient's family verbalized understanding of all instructions and had no further questions or concerns.    Follow Up:   [facility] - Emergency Department  [address]  [place] [address]  [phone]    As needed, If symptoms worsen      Prescriptions:   There are no discharge medications for this patient.        No future appointments.    The co-signing faculty was physically present in the emergency department and available for consultation and did not participate in the care of this patient.    I am scribing for, and in the presence of, Diamantina Monks, APRN, FNP-BC for services provided on 01/16/2022.  Maggie Petitto, SCRIBE   Maggie Petitto, SCRIBE  01/16/2022, 11:36    I personally performed the services described in this documentation, as scribed  in my presence, and it is both accurate  and complete.    Diamantina Monks, APRN,FNP-BC      *Parts of this patients chart were completed in a retrospective fashion due to simultaneous direct patient care activities in the Emergency Department.   *This note was partially generated using M[employer] Fluency Direct system, and there may be some incorrect words, spellings, and punctuation that were not noted in checking the note before saving.
